# Patient Record
Sex: Male | Born: 1965 | Hispanic: Yes | Marital: Married | Smoking: Never smoker
Health system: Southern US, Community
[De-identification: ages and names within clinical notes are randomized; demographics above are authoritative.]

## PROBLEM LIST (undated history)

## (undated) DIAGNOSIS — K2970 Gastritis, unspecified, without bleeding: Secondary | ICD-10-CM

## (undated) DIAGNOSIS — E119 Type 2 diabetes mellitus without complications: Secondary | ICD-10-CM

---

## 2006-10-22 ENCOUNTER — Ambulatory Visit: Payer: Self-pay | Admitting: Internal Medicine

## 2007-09-17 ENCOUNTER — Emergency Department: Payer: Self-pay | Admitting: Emergency Medicine

## 2019-10-27 ENCOUNTER — Other Ambulatory Visit: Payer: Self-pay

## 2019-10-27 DIAGNOSIS — Z20822 Contact with and (suspected) exposure to covid-19: Secondary | ICD-10-CM

## 2019-10-29 LAB — NOVEL CORONAVIRUS, NAA: SARS-CoV-2, NAA: DETECTED — AB

## 2022-04-17 ENCOUNTER — Emergency Department: Payer: BC Managed Care – PPO

## 2022-04-17 ENCOUNTER — Other Ambulatory Visit: Payer: Self-pay

## 2022-04-17 ENCOUNTER — Emergency Department
Admission: EM | Admit: 2022-04-17 | Discharge: 2022-04-17 | Disposition: A | Discharge status: Home / Self Care | Payer: BC Managed Care – PPO | Attending: Emergency Medicine | Admitting: Emergency Medicine

## 2022-04-17 DIAGNOSIS — E871 Hypo-osmolality and hyponatremia: Secondary | ICD-10-CM | POA: Insufficient documentation

## 2022-04-17 DIAGNOSIS — R1013 Epigastric pain: Secondary | ICD-10-CM | POA: Diagnosis not present

## 2022-04-17 DIAGNOSIS — R1010 Upper abdominal pain, unspecified: Secondary | ICD-10-CM

## 2022-04-17 DIAGNOSIS — R7401 Elevation of levels of liver transaminase levels: Secondary | ICD-10-CM | POA: Diagnosis not present

## 2022-04-17 DIAGNOSIS — R079 Chest pain, unspecified: Secondary | ICD-10-CM | POA: Insufficient documentation

## 2022-04-17 DIAGNOSIS — R1011 Right upper quadrant pain: Secondary | ICD-10-CM | POA: Insufficient documentation

## 2022-04-17 DIAGNOSIS — E119 Type 2 diabetes mellitus without complications: Secondary | ICD-10-CM | POA: Insufficient documentation

## 2022-04-17 DIAGNOSIS — R748 Abnormal levels of other serum enzymes: Secondary | ICD-10-CM

## 2022-04-17 HISTORY — DX: Type 2 diabetes mellitus without complications: E11.9

## 2022-04-17 HISTORY — DX: Gastritis, unspecified, without bleeding: K29.70

## 2022-04-17 LAB — TROPONIN I (HIGH SENSITIVITY)
Troponin I (High Sensitivity): 6 ng/L (ref <18)
Troponin I (High Sensitivity): 8 ng/L (ref <18)

## 2022-04-17 LAB — BASIC METABOLIC PANEL
Anion gap: 8 (ref 5–15)
BUN: 13 mg/dL (ref 6–20)
CO2: 24 mmol/L (ref 22–32)
Calcium: 8.9 mg/dL (ref 8.9–10.3)
Chloride: 98 mmol/L (ref 98–111)
Creatinine, Ser: 0.73 mg/dL (ref 0.61–1.24)
GFR, Estimated: >60 mL/min (ref >60)
Glucose, Bld: 368 mg/dL — ABNORMAL HIGH (ref 70–99)
Potassium: 3.7 mmol/L (ref 3.5–5.1)
Sodium: 130 mmol/L — ABNORMAL LOW (ref 135–145)

## 2022-04-17 LAB — HEPATIC FUNCTION PANEL
ALT: 107 U/L — ABNORMAL HIGH (ref 0–44)
AST: 281 U/L — ABNORMAL HIGH (ref 15–41)
Albumin: 3.9 g/dL (ref 3.5–5.0)
Alkaline Phosphatase: 132 U/L — ABNORMAL HIGH (ref 38–126)
Bilirubin, Direct: 0.7 mg/dL — ABNORMAL HIGH (ref 0.0–0.2)
Indirect Bilirubin: 1.3 mg/dL — ABNORMAL HIGH (ref 0.3–0.9)
Total Bilirubin: 2 mg/dL — ABNORMAL HIGH (ref 0.3–1.2)
Total Protein: 7.7 g/dL (ref 6.5–8.1)

## 2022-04-17 LAB — CBC
HCT: 46 % (ref 39.0–52.0)
Hemoglobin: 15.5 g/dL (ref 13.0–17.0)
MCH: 29.4 pg (ref 26.0–34.0)
MCHC: 33.7 g/dL (ref 30.0–36.0)
MCV: 87.3 fL (ref 80.0–100.0)
Platelets: 260 10*3/uL (ref 150–400)
RBC: 5.27 MIL/uL (ref 4.22–5.81)
RDW: 12.1 % (ref 11.5–15.5)
WBC: 8.9 10*3/uL (ref 4.0–10.5)
nRBC: 0 % (ref 0.0–0.2)

## 2022-04-17 LAB — LIPASE, BLOOD: Lipase: 30 U/L (ref 11–51)

## 2022-04-17 MED ORDER — IOHEXOL 300 MG/ML  SOLN
100.0000 mL | Freq: Once | INTRAMUSCULAR | Status: AC | PRN
  Administered 2022-04-17: 100 mL via INTRAVENOUS

## 2022-04-17 MED ORDER — PANTOPRAZOLE SODIUM 40 MG PO TBEC: 40.0000 mg | DELAYED_RELEASE_TABLET | Freq: Every day | ORAL | 1 refills | Status: AC

## 2022-04-17 MED ORDER — ONDANSETRON HCL 4 MG/2ML IJ SOLN
4.0000 mg | Freq: Once | INTRAMUSCULAR | Status: AC
  Administered 2022-04-17: 4 mg via INTRAVENOUS
  Filled 2022-04-17: qty 2

## 2022-04-17 MED ORDER — TRAMADOL HCL 50 MG PO TABS: 50.0000 mg | ORAL_TABLET | Freq: Four times a day (QID) | ORAL | 0 refills | Status: AC | PRN

## 2022-04-17 MED ORDER — FAMOTIDINE IN NACL 20-0.9 MG/50ML-% IV SOLN
20.0000 mg | Freq: Once | INTRAVENOUS | Status: AC
Start: 2022-04-17 — End: 2022-04-17
  Administered 2022-04-17: 20 mg via INTRAVENOUS
  Filled 2022-04-17: qty 50

## 2022-04-17 MED ORDER — MORPHINE SULFATE (PF) 4 MG/ML IV SOLN
4.0000 mg | Freq: Once | INTRAVENOUS | Status: AC
  Administered 2022-04-17: 4 mg via INTRAVENOUS
  Filled 2022-04-17: qty 1

## 2022-04-17 MED ORDER — SODIUM CHLORIDE 0.9 % IV BOLUS
1000.0000 mL | Freq: Once | INTRAVENOUS | Status: AC
  Administered 2022-04-17: 1000 mL via INTRAVENOUS

## 2022-04-17 NOTE — ED Provider Notes (Signed)
[facility] Provider Note    Event Date/Time   First MD Initiated Contact with Patient 04/17/22 1428     (approximate)   History   Chest Pain   HPI  Guy Williamson is a 56 y.o. male with history of diabetes presents with midepigastric pain, denies cp or sob.  Patient states he is not taking his diabetes medications.  States he ate verde sauce this morning and pain started.        Physical Exam   Triage Vital Signs: ED Triage Vitals  Enc Vitals Group     BP 04/17/22 1306 (!) 149/108     Pulse Rate 04/17/22 1306 81     Resp 04/17/22 1306 18     Temp 04/17/22 1306 98.1 F (36.7 C)     Temp Source 04/17/22 1306 Oral     SpO2 04/17/22 1306 94 %     Weight 04/17/22 1304 240 lb (108.9 kg)     Height 04/17/22 1305 '5\' 7"'  (1.702 m)     Head Circumference --      Peak Flow --      Pain Score 04/17/22 1304 8     Pain Loc --      Pain Edu? --      Excl. in [place]? --     Most recent vital signs: Vitals:   04/17/22 1500 04/17/22 1545  BP: (!) 151/87   Pulse: 73 79  Resp: (!) 23 16  Temp:    SpO2: 97% 99%     General: Awake, no distress.   CV:  Good peripheral perfusion. regular rate and  rhythm Resp:  Normal effort. Lungs CTA Abd:  No distention.  Tender in the epigastric and right upper quadrant Other:      ED Results / Procedures / Treatments   Labs (all labs ordered are listed, but only abnormal results are displayed) Labs Reviewed  BASIC METABOLIC PANEL - Abnormal; Notable for the following components:      Result Value   Sodium 130 (*)    Glucose, Bld 368 (*)    All other components within normal limits  HEPATIC FUNCTION PANEL - Abnormal; Notable for the following components:   AST 281 (*)    ALT 107 (*)    Alkaline Phosphatase 132 (*)    Total Bilirubin 2.0 (*)    Bilirubin, Direct 0.7 (*)    Indirect Bilirubin 1.3 (*)    All other components within normal limits  CBC  LIPASE, BLOOD  TROPONIN I (HIGH  SENSITIVITY)  TROPONIN I (HIGH SENSITIVITY)     EKG EKG    RADIOLOGY Ultrasound right upper quadrant, CT abdomen/pelvis    PROCEDURES:   Procedures   MEDICATIONS ORDERED IN ED: Medications  sodium chloride 0.9 % bolus 1,000 mL (0 mLs Intravenous Stopped 04/17/22 1911)  famotidine (PEPCID) IVPB 20 mg premix (0 mg Intravenous Stopped 04/17/22 1551)  morphine (PF) 4 MG/ML injection 4 mg (4 mg Intravenous Given 04/17/22 1502)  ondansetron (ZOFRAN) injection 4 mg (4 mg Intravenous Given 04/17/22 1501)  iohexol (OMNIPAQUE) 300 MG/ML solution 100 mL (100 mLs Intravenous Contrast Given 04/17/22 1820)     IMPRESSION / MDM / ASSESSMENT AND PLAN / ED COURSE  I reviewed the triage vital signs and the nursing notes.                              Differential diagnosis includes,  but is not limited to, acute cholecystitis, MI, gastritis, PUD, pancreatitis, mass  Patient's presentation is most consistent with acute illness / injury with system symptoms.   EKG shows normal sinus rhythm, no STEMI noted, see physician  CBC and lipase along with troponin are normal which is reassuring, basic metabolic panel shows a low sodium of 130 along with elevated glucose of 368.  Patient is known noncompliant diabetic.  Anion gap is normal so no DKA.  Patient's liver enzymes are also elevated, AST is 281, ALT is 107, alk phos is 132, total bili is are 2.0 and direct bilirubin 0.7 and indirect is 1.3  Ultrasound right upper quadrant was interpreted by me as having a gallstone, read as sludge and cholelithiasis along with the possible hemangioma per radiology.  CT abdomen/pelvis interpreted by me as, radiology confirms most likely benign hemangioma of the liver along with fatty liver.  Patient states he feels much better after having medication, patient had been given morphine, Zofran, Pepcid along with normal saline 1 L.  States he is not having any abdominal pain at this time.  He does appear to be  more comfortable.  We did discuss all of the results.  He is to follow-up with his regular doctor at [facility] clinic tomorrow.  He is given a prescription for Protonix and tramadol.  He is to avoid alcohol intake.  Did explain the elevated liver enzymes can lead to cirrhosis.  He does need to see a surgeon to possibly have his gallbladder removed.  Patient agrees with treatment plan.  He was discharged stable condition.       FINAL CLINICAL IMPRESSION(S) / ED DIAGNOSES   Final diagnoses:  Pain of upper abdomen  Elevated liver enzymes     Rx / DC Orders   ED Discharge Orders          Ordered    pantoprazole (PROTONIX) 40 MG tablet  Daily        04/17/22 1902    traMADol (ULTRAM) 50 MG tablet  Every 6 hours PRN        04/17/22 1905             Note:  This document was prepared using Dragon voice recognition software and may include unintentional dictation errors.    Versie Starks, PA-C 04/17/22 1912    Arta Silence, MD 04/25/22 [phone]

## 2022-04-17 NOTE — ED Notes (Signed)
Patient transported to CT 

## 2022-04-17 NOTE — ED Notes (Signed)
US at bedside

## 2022-04-17 NOTE — ED Notes (Signed)
Discharge instructions including follow up care with general surgery, GI, and prescription discussed with pt. Pt verbalized understanding with no questions at this time. Pt to go home with family at bedside.

## 2022-04-17 NOTE — ED Triage Notes (Signed)
Pt here with epigastric pain that started this morning. Pt denies N/V/D. Pt states pain stays in the center of her upper abd. Pt has hx of gastritis.

## 2022-04-18 ENCOUNTER — Telehealth: Payer: Self-pay

## 2022-04-18 NOTE — Telephone Encounter (Signed)
Message left via [employer]. Patient needs to call the office to schedule a follow up after ER visit for biliary colic.

## 2023-05-27 IMAGING — US US ABDOMEN LIMITED
1 series · 14 of 25 positions shown · non-contrast
Comparison: None Available.

CLINICAL DATA: Right upper quadrant pain

EXAM:
ULTRASOUND ABDOMEN LIMITED RIGHT UPPER QUADRANT

[Series 1: us abdomen limited ruq (liver/gb) · 14 of 210 slices shown]
[im 1/210]
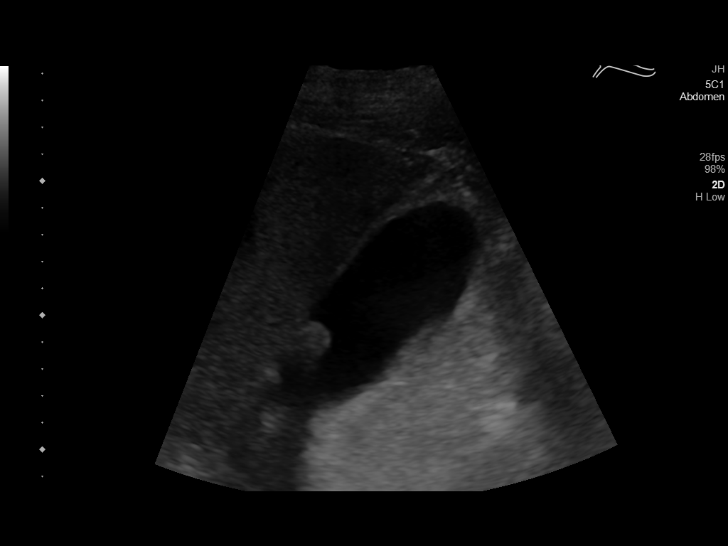
[im 18/210]
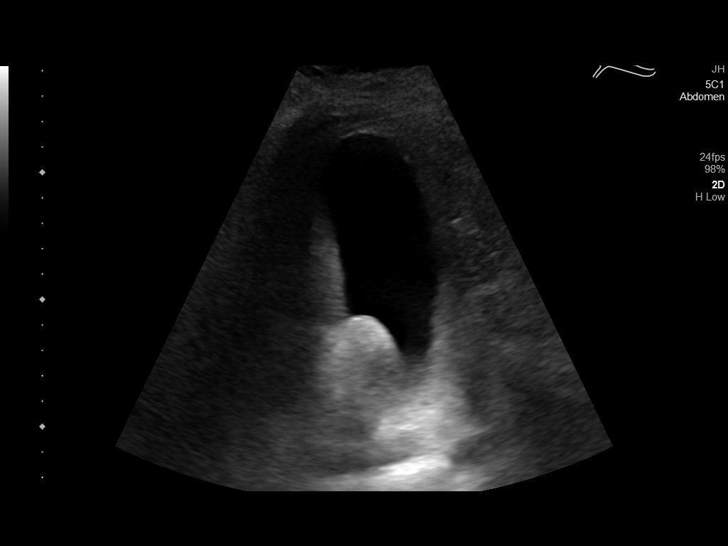
[im 35/210]
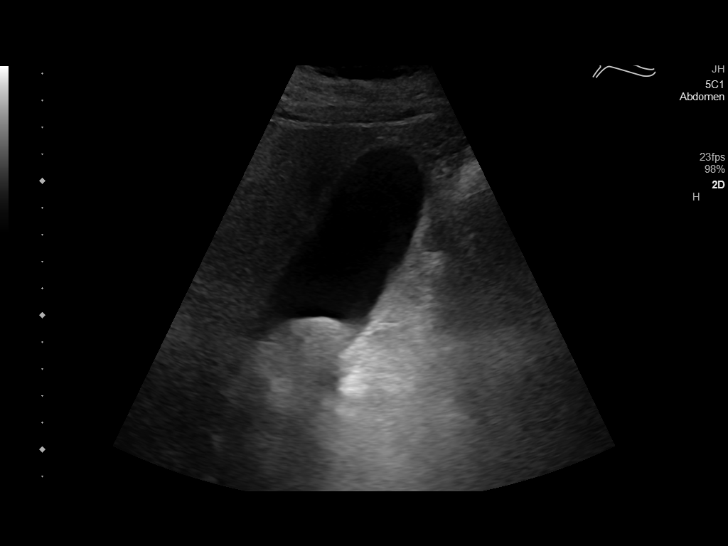
[im 53/210]
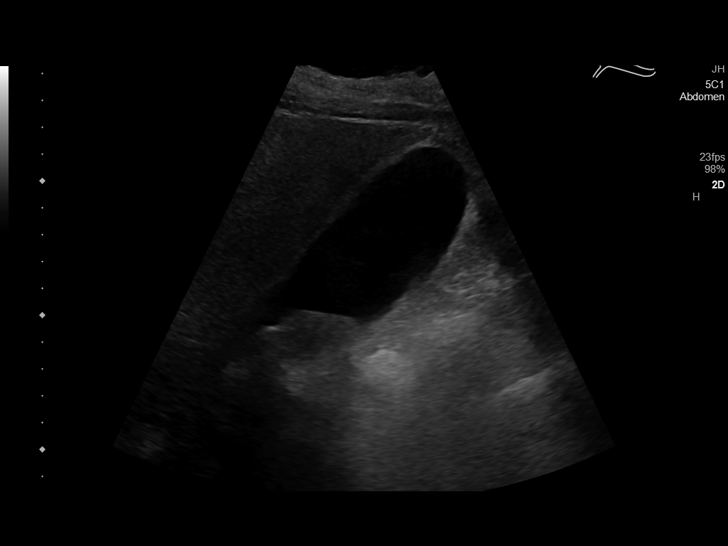
[im 70/210]
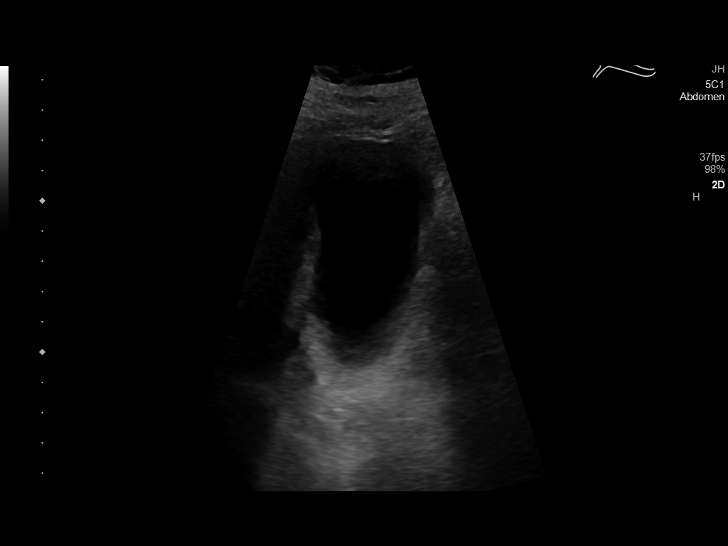
[im 79/210]
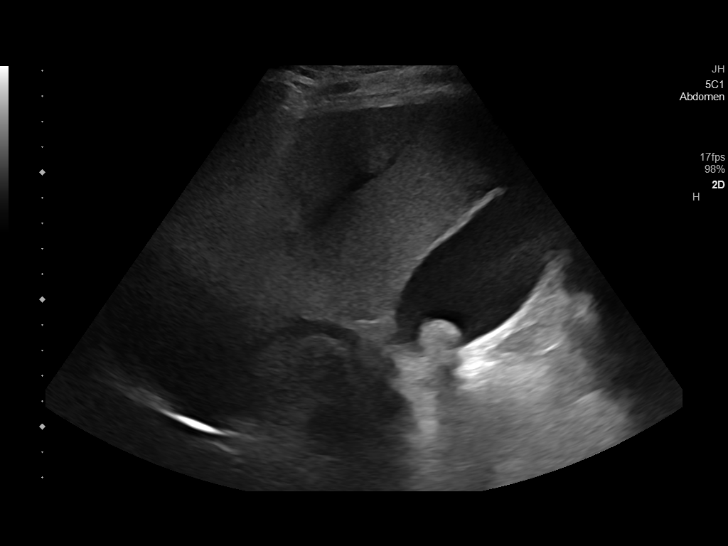
[im 96/210]
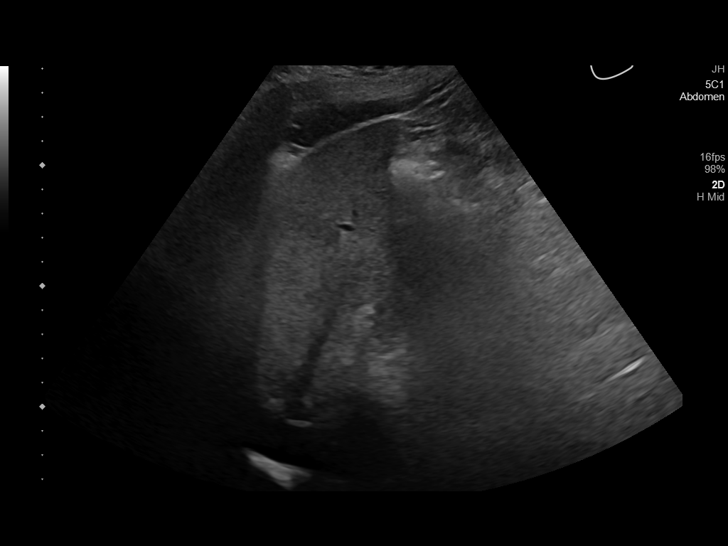
[im 114/210]
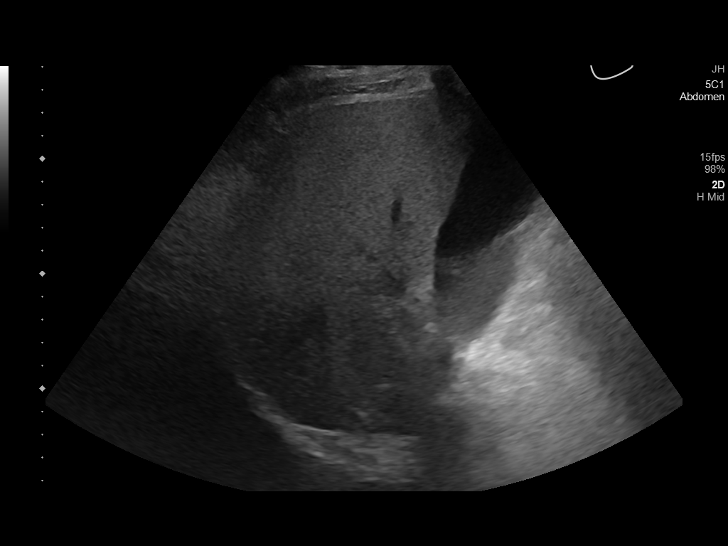
[im 131/210]
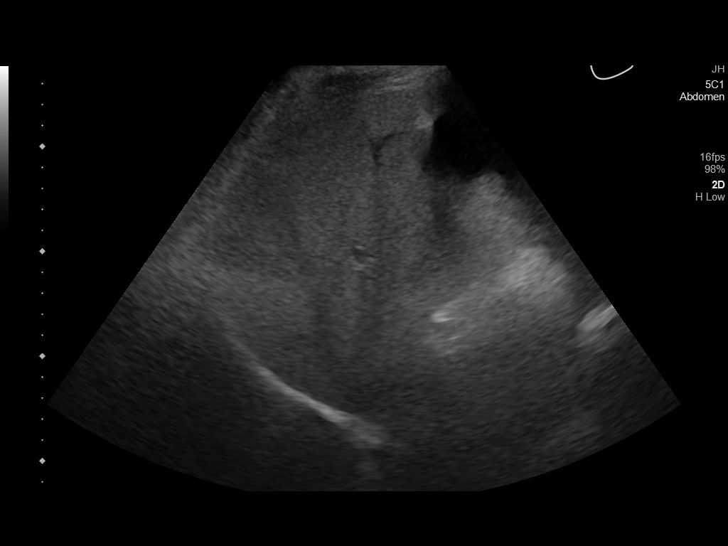
[im 140/210]
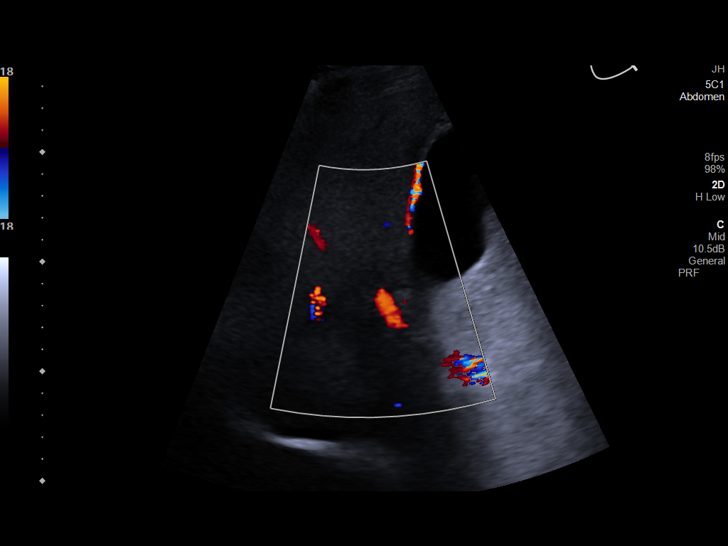
[im 157/210]
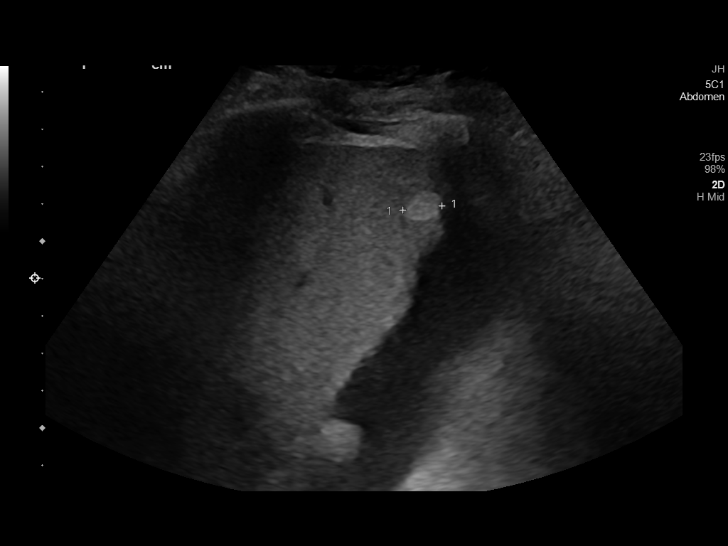
[im 175/210]
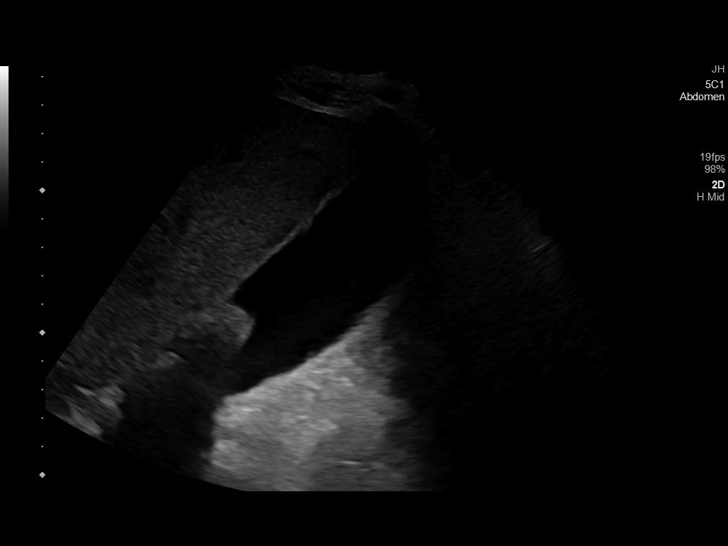
[im 192/210]
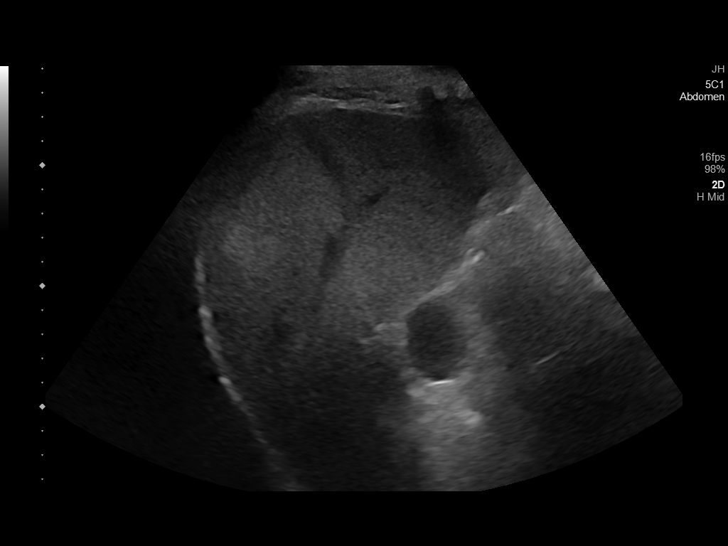
[im 210/210]
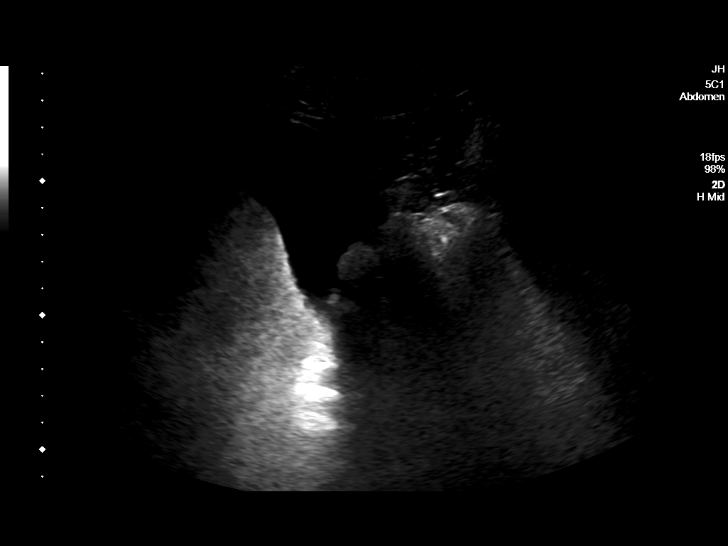

[14 of 25 positions shown; findings below may reference images not displayed]

FINDINGS: Gallbladder:

Cholelithiasis with gallbladder sludge. No gallbladder wall
thickening or pericholecystic fluid.

Common bile duct:

Diameter: 7 mm

Liver:

the right liver adjacent to the gallbladder fossa, favoring a benign
hepatic hemangioma, although poorly evaluated. Portal vein is patent
on color Doppler imaging with normal direction of blood flow towards
the liver.

Other: None.
IMPRESSION: Cholelithiasis, without associated sonographic findings to suggest
acute cholecystitis.

11 mm echogenic lesion in the right liver adjacent to the
gallbladder fossa, favoring a benign hepatic hemangioma, although
poorly evaluated.

Hepatic steatosis.

## 2023-05-27 IMAGING — CT CT ABD-PELV W/ CM
2 of 5 series · 16 of 46 positions shown, 18 images · IV contrast (APPLIED)
Comparison: 10/22/2006

CLINICAL DATA: Abdominal pain

EXAM:
CT ABDOMEN AND PELVIS WITH CONTRAST
TECHNIQUE: Multidetector CT imaging of the abdomen and pelvis was performed
using the standard protocol following bolus administration of
intravenous contrast.

[Series 2: abdomen 5.0 · axial · 0.89mm/px · z∈[+924,+1344]mm · 13 of 98 slices shown, 15 images]
[im 7/98  soft-tissue]
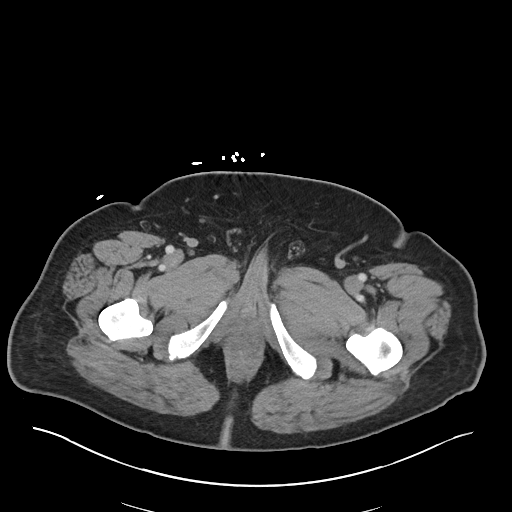
[im 7/98  bone]
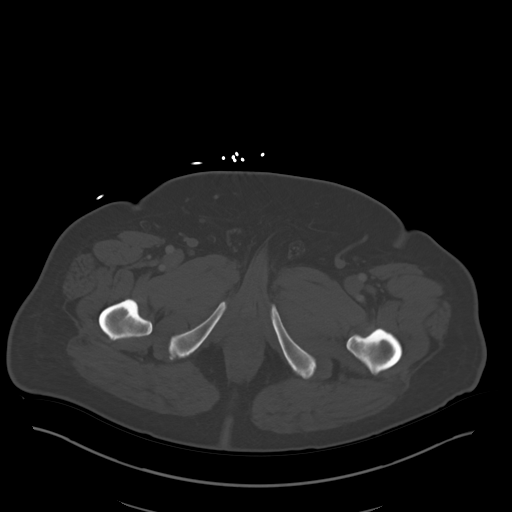
[im 14/98  soft-tissue]
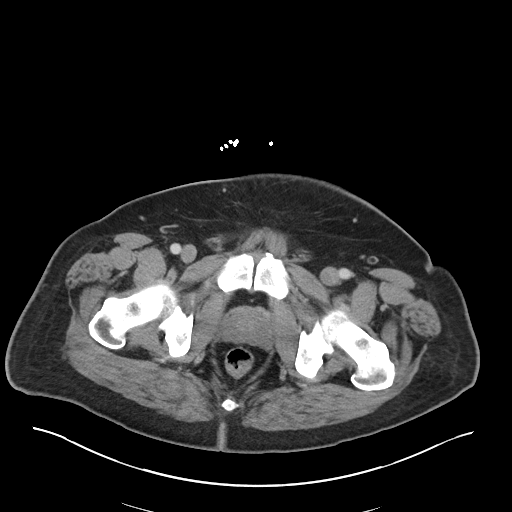
[im 21/98  soft-tissue]
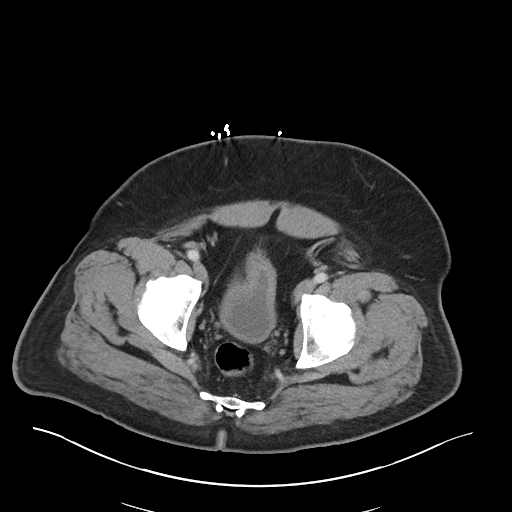
[im 28/98  soft-tissue]
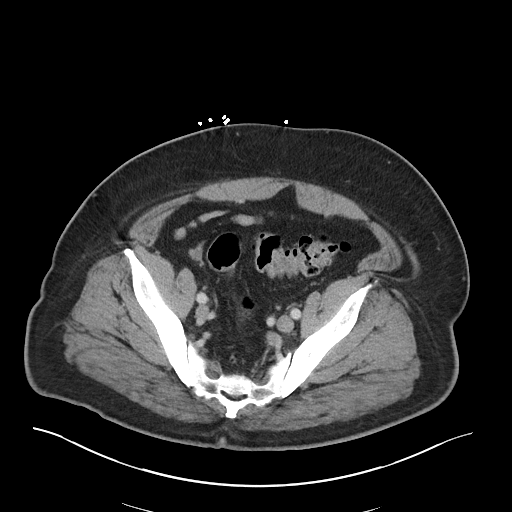
[im 35/98  soft-tissue]
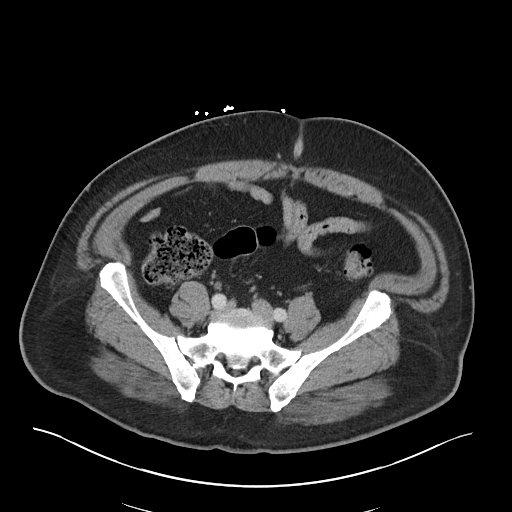
[im 42/98  soft-tissue]
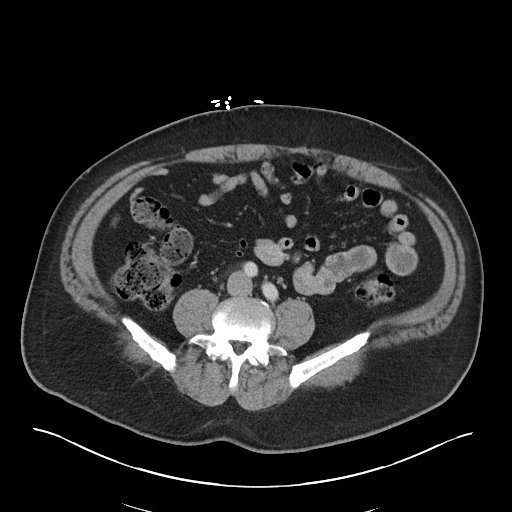
[im 49/98  soft-tissue]
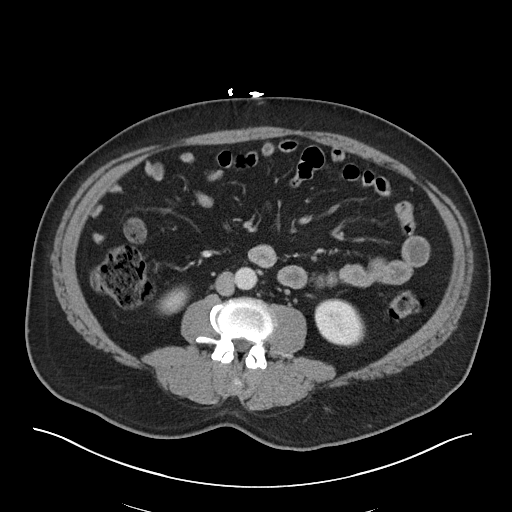
[im 56/98  soft-tissue]
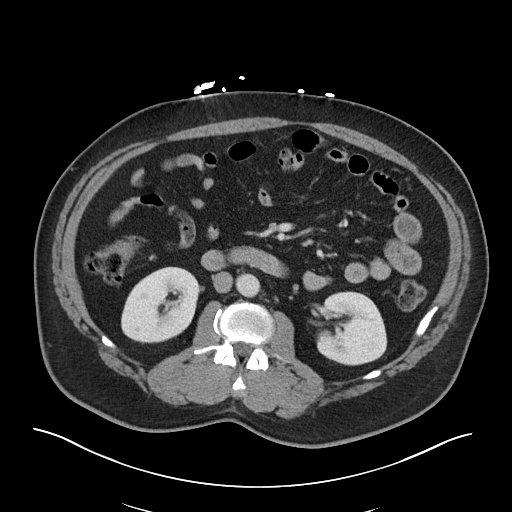
[im 63/98  soft-tissue]
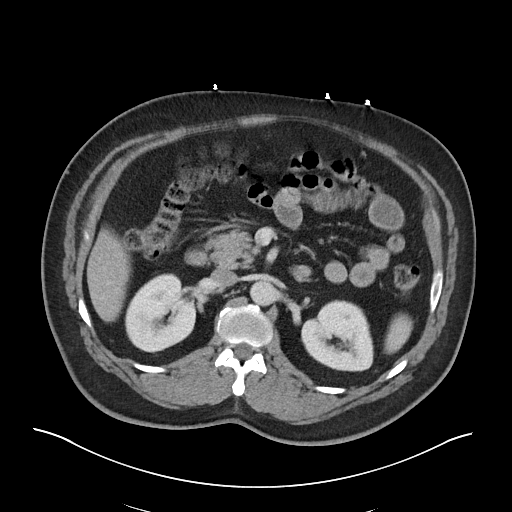
[im 63/98  bone]
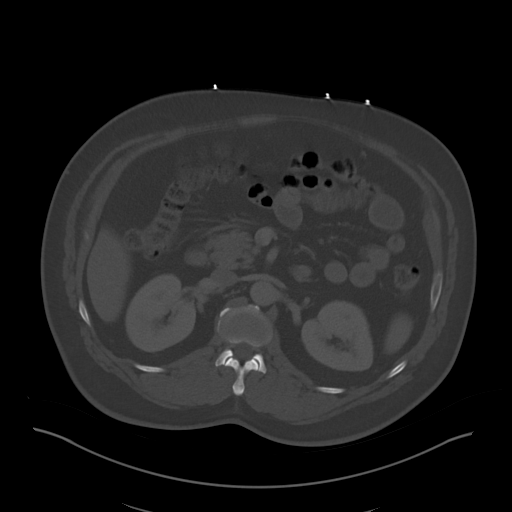
[im 70/98  soft-tissue]
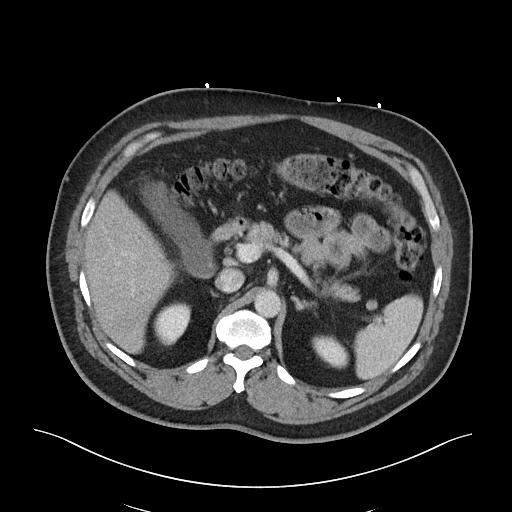
[im 77/98  soft-tissue]
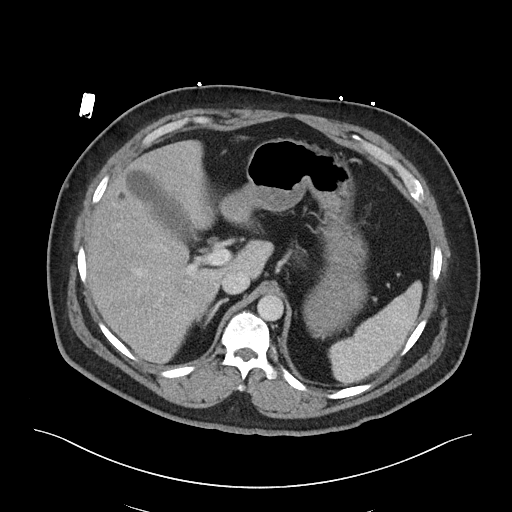
[im 84/98  soft-tissue]
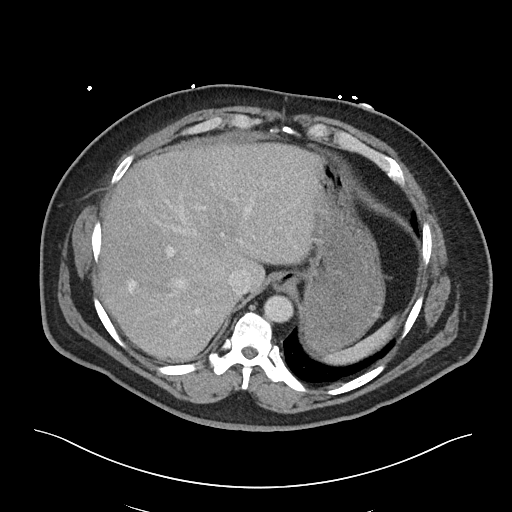
[im 91/98  soft-tissue]
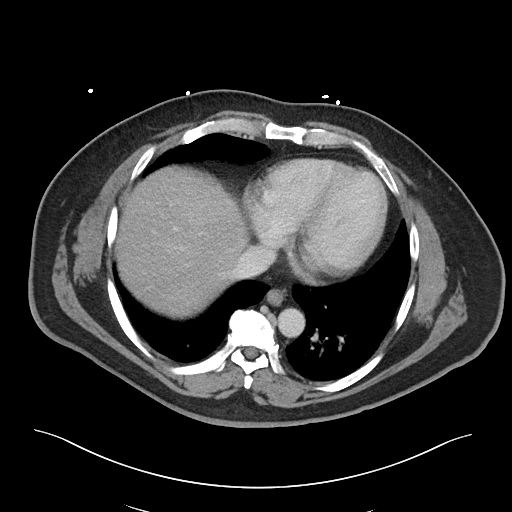

[Series 5: abdomen 3.0 mpr cor · coronal · 0.85mm/px · 3 of 111 slices shown]
[im 37/111  soft-tissue]
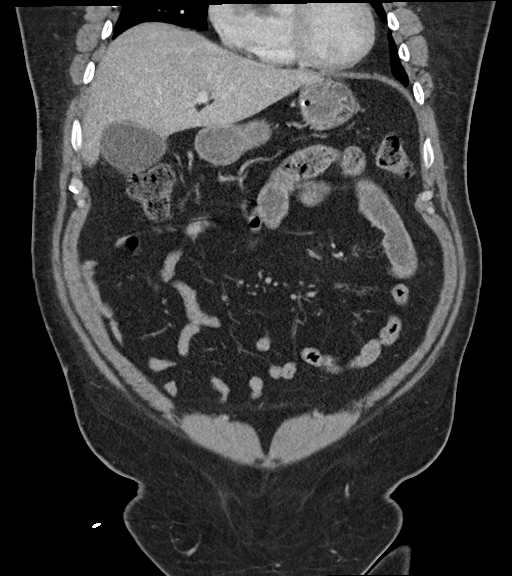
[im 49/111  soft-tissue]
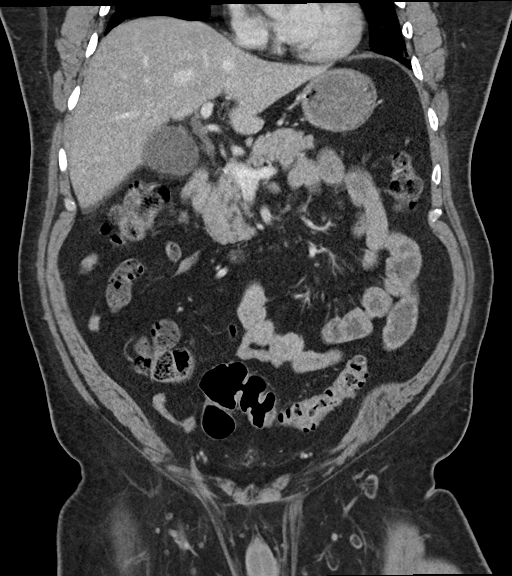
[im 62/111  soft-tissue]
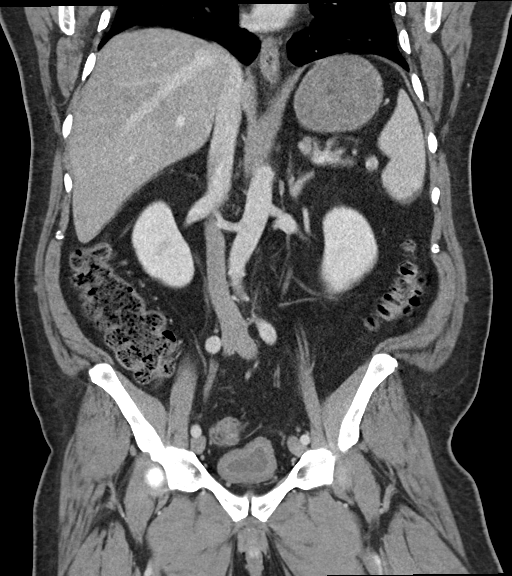

[16 of 46 positions shown; findings below may reference images not displayed]

RADIATION DOSE REDUCTION: This exam was performed according to the
departmental dose-optimization program which includes automated
exposure control, adjustment of the mA and/or kV according to
patient size and/or use of iterative reconstruction technique.

CONTRAST:  100mL OMNIPAQUE IOHEXOL 300 MG/ML  SOLN
FINDINGS: Lower chest: Breathing motion limits evaluation of lower lung
fields. As far as seen, there is no focal consolidation in the
visualized lower lung fields.

Hepatobiliary: There is fatty infiltration in the liver. There is no
dilation of bile ducts. There is small hypervascular structure in
the upper aspect of right lobe, possibly flash filling hemangioma.
There is 7 mm fat attenuation structure in the liver close to
gallbladder fossa, possibly cyst or lipoma. Gallbladder is
distended. There is no wall thickening in gallbladder. There is no
fluid around the gallbladder.

Pancreas: No focal abnormality is seen.

Spleen: Unremarkable.

Adrenals/Urinary Tract: Adrenals are unremarkable. There is no
hydronephrosis. There are no renal or ureteral stones. There is 4 mm
low-density in the lateral aspect of midportion of left kidney,
possibly a cyst. Urinary bladder is not distended. There is diffuse
wall thickening in the bladder.

Stomach/Bowel: Stomach is unremarkable. Small bowel loops are not
dilated. Appendix is not dilated. There is no significant wall
thickening in colon. Scattered diverticula are seen in colon without
signs of focal acute diverticulitis.

Vascular/Lymphatic: Unremarkable.

Reproductive: Unremarkable.

Other: There is no ascites or pneumoperitoneum.

Musculoskeletal: Unremarkable.
IMPRESSION: There is no evidence of intestinal obstruction or pneumoperitoneum.
There is no hydronephrosis. Appendix is not dilated.

Diverticulosis of colon without signs of diverticulitis. Fatty
liver. Other findings as described in the body of the report.

## 2023-05-27 IMAGING — CR DG HUMERUS 2V *L*
1 series · 2 of 2 positions shown · non-contrast
Comparison: None Available.

CLINICAL DATA: Left arm pain after fall.

EXAM:
LEFT HUMERUS - 2+ VIEW

[Series 1: dg humerus left · 0.14mm/px · 2 of 2 slices shown]
[im 1/2]
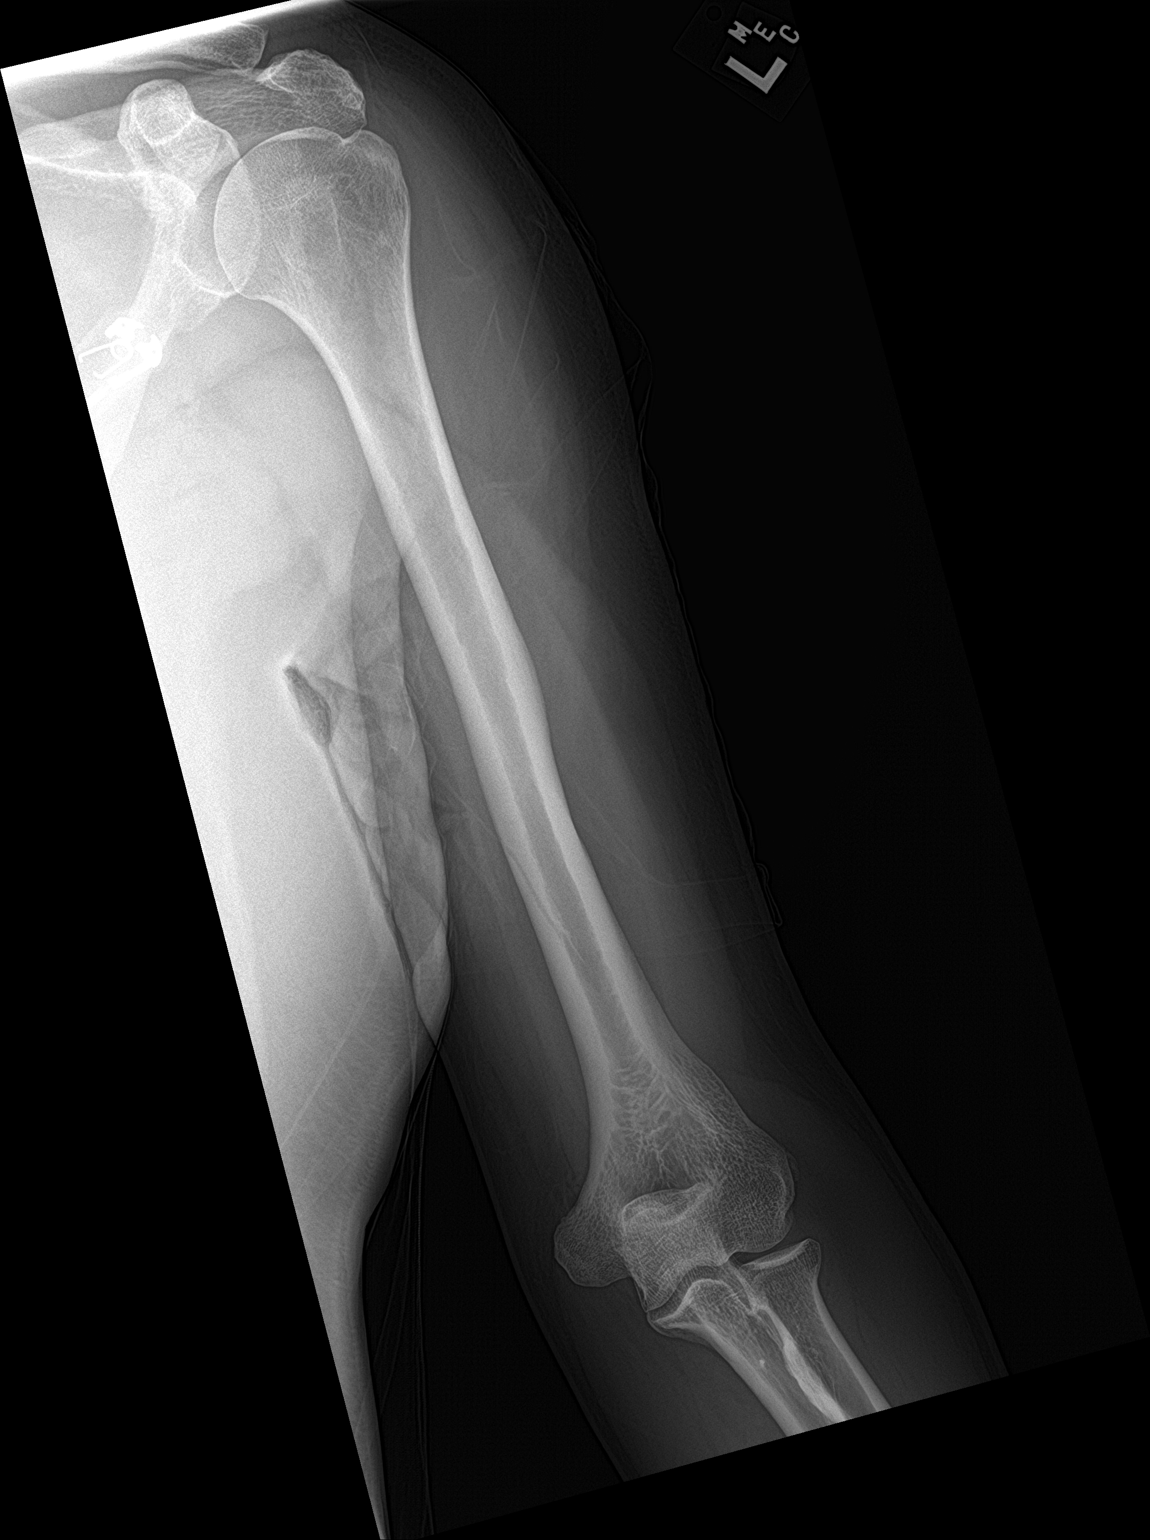
[im 2/2]
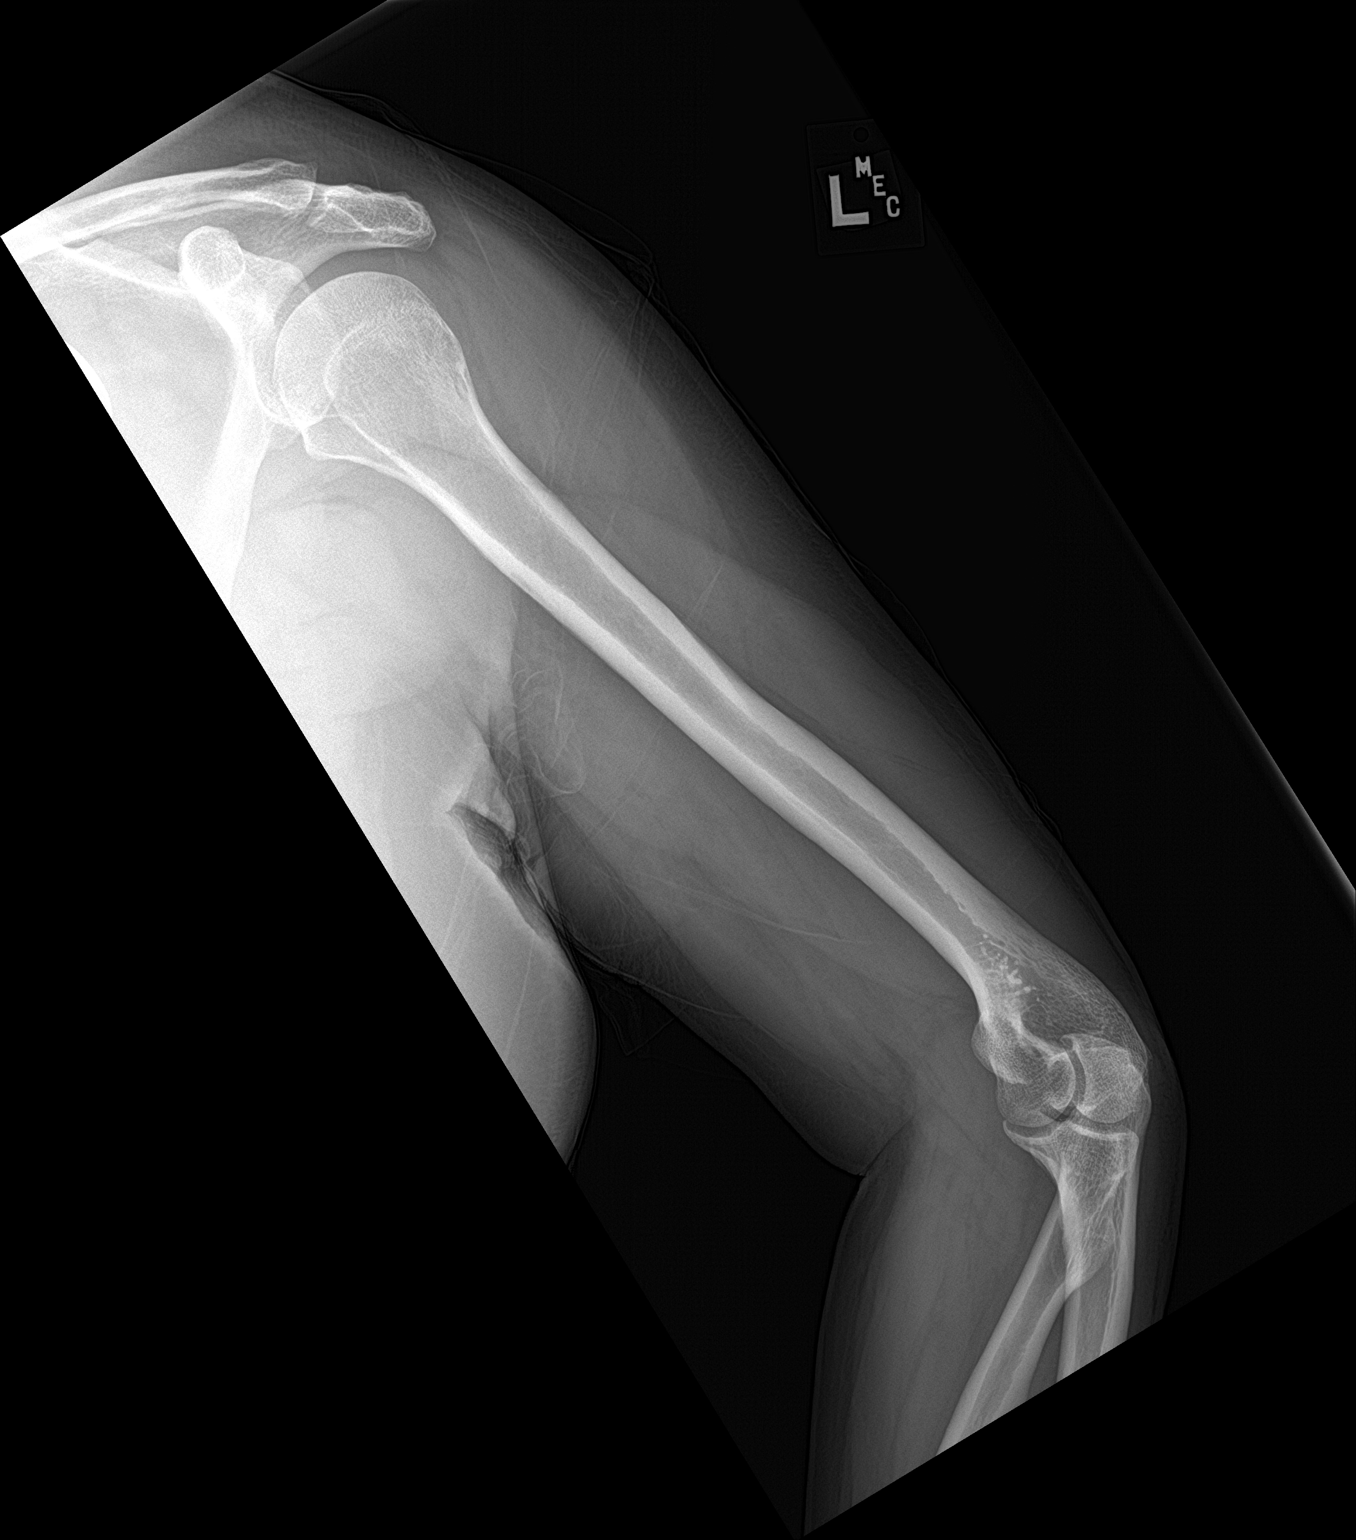

[2 of 2 positions shown; findings below may reference images not displayed]

FINDINGS: There is no evidence of fracture or other focal bone lesions. Soft
tissues are unremarkable.
IMPRESSION: Negative.

## 2023-05-27 IMAGING — CR DG CHEST 2V
1 series · 2 of 2 positions shown · non-contrast
Comparison: None Available.

CLINICAL DATA: Chest pain.

EXAM:
CHEST - 2 VIEW

[Series 1: dg chest 2 view · 0.14mm/px · 2 of 2 slices shown]
[im 1/2]
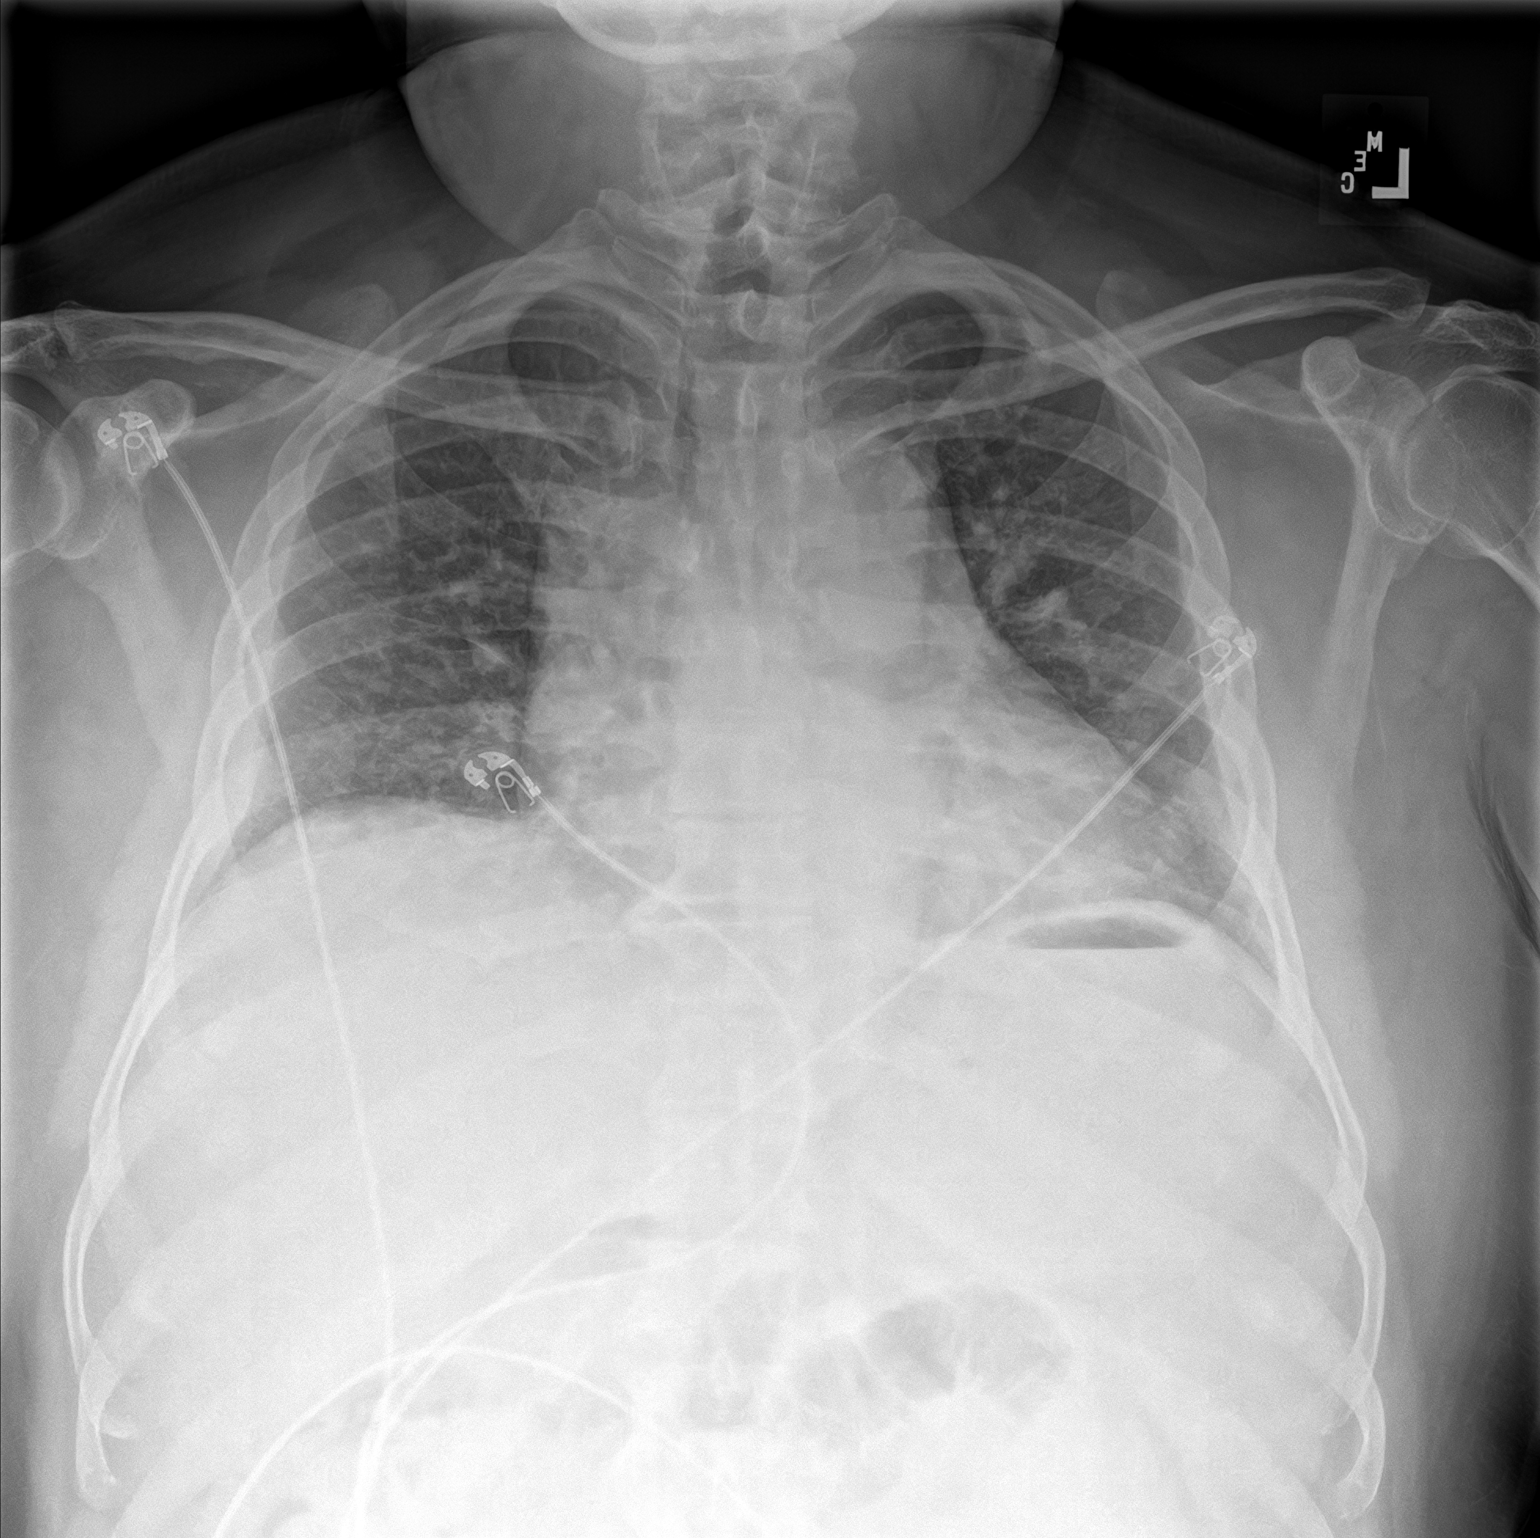
[im 2/2]
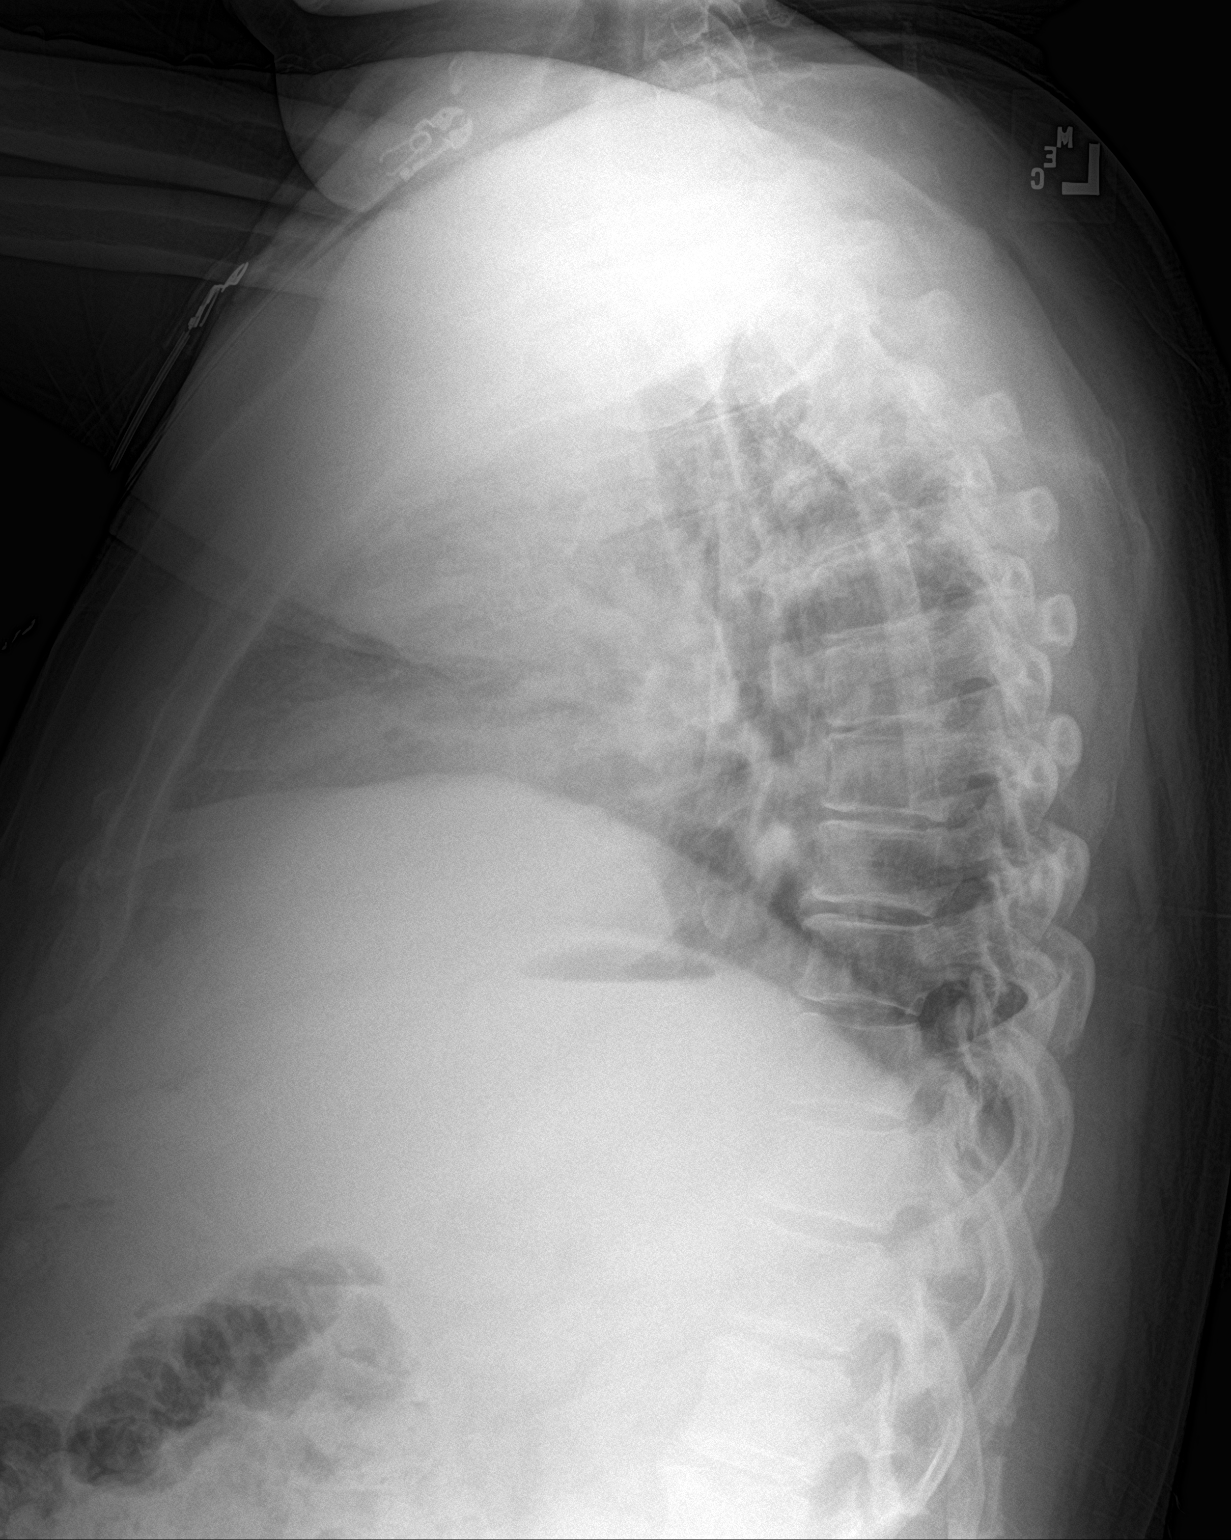

[2 of 2 positions shown; findings below may reference images not displayed]

FINDINGS: Mild cardiomegaly is noted. Hypoinflation of the lungs is noted with
minimal bibasilar subsegmental atelectasis. Bony thorax is
unremarkable.
IMPRESSION: Hypoinflation of the lungs with minimal bibasilar subsegmental
atelectasis.
